# Patient Record
Sex: Female | Born: 1990 | Race: White | Hispanic: No | Marital: Single | Smoking: Current every day smoker
Health system: Southern US, Community
[De-identification: ages and names within clinical notes are randomized; demographics above are authoritative.]

## PROBLEM LIST (undated history)

## (undated) DIAGNOSIS — F410 Panic disorder [episodic paroxysmal anxiety] without agoraphobia: Secondary | ICD-10-CM

## (undated) DIAGNOSIS — I493 Ventricular premature depolarization: Secondary | ICD-10-CM

## (undated) DIAGNOSIS — I10 Essential (primary) hypertension: Secondary | ICD-10-CM

## (undated) DIAGNOSIS — J45909 Unspecified asthma, uncomplicated: Secondary | ICD-10-CM

## (undated) HISTORY — DX: Unspecified asthma, uncomplicated: J45.909

---

## 2017-11-07 ENCOUNTER — Encounter (HOSPITAL_COMMUNITY): Payer: Self-pay

## 2017-11-07 ENCOUNTER — Emergency Department (HOSPITAL_COMMUNITY)
Admission: EM | Admit: 2017-11-07 | Discharge: 2017-11-08 | Disposition: A | Discharge status: Home / Self Care | Payer: Medicaid Other | Attending: Emergency Medicine | Admitting: Emergency Medicine

## 2017-11-07 DIAGNOSIS — R202 Paresthesia of skin: Secondary | ICD-10-CM

## 2017-11-07 DIAGNOSIS — F17200 Nicotine dependence, unspecified, uncomplicated: Secondary | ICD-10-CM | POA: Diagnosis not present

## 2017-11-07 DIAGNOSIS — R200 Anesthesia of skin: Secondary | ICD-10-CM | POA: Diagnosis present

## 2017-11-07 DIAGNOSIS — Z91040 Latex allergy status: Secondary | ICD-10-CM | POA: Diagnosis not present

## 2017-11-07 DIAGNOSIS — I10 Essential (primary) hypertension: Secondary | ICD-10-CM | POA: Insufficient documentation

## 2017-11-07 HISTORY — DX: Essential (primary) hypertension: I10

## 2017-11-07 HISTORY — DX: Panic disorder (episodic paroxysmal anxiety): F41.0

## 2017-11-07 HISTORY — DX: Ventricular premature depolarization: I49.3

## 2017-11-07 LAB — I-STAT CHEM 8, ED
BUN: 9 mg/dL (ref 6–20)
CALCIUM ION: 1.12 mmol/L — AB (ref 1.15–1.40)
CHLORIDE: 104 mmol/L (ref 98–111)
CREATININE: 0.9 mg/dL (ref 0.44–1.00)
Glucose, Bld: 110 mg/dL — ABNORMAL HIGH (ref 70–99)
HEMATOCRIT: 42 % (ref 36.0–46.0)
HEMOGLOBIN: 14.3 g/dL (ref 12.0–15.0)
Potassium: 3.7 mmol/L (ref 3.5–5.1)
Sodium: 140 mmol/L (ref 135–145)
TCO2: 26 mmol/L (ref 22–32)

## 2017-11-07 LAB — DIFFERENTIAL
Abs Immature Granulocytes: 0.1 10*3/uL (ref 0.0–0.1)
Basophils Absolute: 0.1 10*3/uL (ref 0.0–0.1)
Basophils Relative: 1 %
Eosinophils Absolute: 0.9 10*3/uL — ABNORMAL HIGH (ref 0.0–0.7)
Eosinophils Relative: 6 %
Immature Granulocytes: 1 %
Lymphocytes Relative: 32 %
Lymphs Abs: 4.7 10*3/uL — ABNORMAL HIGH (ref 0.7–4.0)
MONO ABS: 1.1 10*3/uL — AB (ref 0.1–1.0)
Monocytes Relative: 8 %
NEUTROS ABS: 7.9 10*3/uL — AB (ref 1.7–7.7)
NEUTROS PCT: 52 %

## 2017-11-07 LAB — CBC
HCT: 45.1 % (ref 36.0–46.0)
HEMOGLOBIN: 15 g/dL (ref 12.0–15.0)
MCH: 30 pg (ref 26.0–34.0)
MCHC: 33.3 g/dL (ref 30.0–36.0)
MCV: 90.2 fL (ref 78.0–100.0)
Platelets: 344 10*3/uL (ref 150–400)
RBC: 5 MIL/uL (ref 3.87–5.11)
RDW: 12.8 % (ref 11.5–15.5)
WBC: 14.8 10*3/uL — ABNORMAL HIGH (ref 4.0–10.5)

## 2017-11-07 LAB — I-STAT TROPONIN, ED: Troponin i, poc: 0 ng/mL (ref 0.00–0.08)

## 2017-11-07 LAB — I-STAT BETA HCG BLOOD, ED (MC, WL, AP ONLY): I-stat hCG, quantitative: <5 m[IU]/mL (ref <5)

## 2017-11-07 LAB — PROTIME-INR
INR: 0.97
Prothrombin Time: 12.8 seconds (ref 11.4–15.2)

## 2017-11-07 LAB — APTT: aPTT: 28 seconds (ref 24–36)

## 2017-11-07 NOTE — ED Triage Notes (Signed)
Pt reports that she has r sided facial numbness that has been going on for years, comes and goes, nothing worse today, no facial droop, neuro intact bilaterally

## 2017-11-08 LAB — COMPREHENSIVE METABOLIC PANEL
ALBUMIN: 4 g/dL (ref 3.5–5.0)
ALT: 40 U/L (ref 0–44)
AST: 27 U/L (ref 15–41)
Alkaline Phosphatase: 90 U/L (ref 38–126)
Anion gap: 9 (ref 5–15)
BILIRUBIN TOTAL: 0.4 mg/dL (ref 0.3–1.2)
BUN: 8 mg/dL (ref 6–20)
CO2: 25 mmol/L (ref 22–32)
CREATININE: 0.91 mg/dL (ref 0.44–1.00)
Calcium: 9.1 mg/dL (ref 8.9–10.3)
Chloride: 106 mmol/L (ref 98–111)
GFR calc Af Amer: >60 mL/min (ref >60)
GLUCOSE: 115 mg/dL — AB (ref 70–99)
Potassium: 3.8 mmol/L (ref 3.5–5.1)
Sodium: 140 mmol/L (ref 135–145)
TOTAL PROTEIN: 6.7 g/dL (ref 6.5–8.1)

## 2017-11-08 NOTE — ED Notes (Signed)
Discharge instructions reviewed with patient. Follow up care reviewed. Signature pad unavailable at time of pt discharge. Pt verbalized understanding of instructions.

## 2017-11-08 NOTE — Discharge Instructions (Signed)
Schedule follow-up with 1 of the listed neurology groups to have outpatient work-up of your numbness and tingling.  If you have any worsening of the symptoms, return to the ER.

## 2017-11-08 NOTE — ED Provider Notes (Signed)
[facility] EMERGENCY DEPARTMENT Provider Note   CSN: [id number] Arrival date & time: 11/07/17  2256     History   Chief Complaint Chief Complaint  Patient presents with  . Numbness    HPI Tracy Harper is a 27 y.o. female.  Patient presents to the ER for evaluation of right facial numbness.  She reports that this has been occurring intermittently for a year or more.  She reports that some days she wakes up and she feels like pins-and-needles on the right side of her face.  This is occurring today.  She has never noticed any facial droop or weakness of the face.  He has never had any speech disturbance or difficulty speaking.  She has had intermittent episodes of similar sensations in both of her arms, but never at the same time.  No history of headaches.     Past Medical History:  Diagnosis Date  . Hypertension   . Panic   . PVC (premature ventricular contraction)     There are no active problems to display for this patient.   History reviewed. No pertinent surgical history.   OB History   None      Home Medications    Prior to Admission medications   Not on File    Family History No family history on file.  Social History Social History   Tobacco Use  . Smoking status: Current Every Day Smoker  . Smokeless tobacco: Never Used  Substance Use Topics  . Alcohol use: Yes  . Drug use: Never     Allergies   Amoxicillin and Latex   Review of Systems Review of Systems  Neurological: Positive for numbness.  All other systems reviewed and are negative.    Physical Exam Updated Vital Signs BP 136/75   Pulse 70   Temp 98 F (36.7 C) (Oral)   Resp 18   LMP 10/24/2017   SpO2 99%   Physical Exam  Constitutional: She is oriented to person, place, and time. She appears well-developed and well-nourished. No distress.  HENT:  Head: Normocephalic and atraumatic.  Right Ear: Hearing normal.  Left Ear: Hearing normal.  Nose:  Nose normal.  Mouth/Throat: Oropharynx is clear and moist and mucous membranes are normal.  Eyes: Pupils are equal, round, and reactive to light. Conjunctivae and EOM are normal.  Neck: Normal range of motion. Neck supple.  Cardiovascular: Regular rhythm, S1 normal and S2 normal. Exam reveals no gallop and no friction rub.  No murmur heard. Pulmonary/Chest: Effort normal and breath sounds normal. No respiratory distress. She exhibits no tenderness.  Abdominal: Soft. Normal appearance and bowel sounds are normal. There is no hepatosplenomegaly. There is no tenderness. There is no rebound, no guarding, no tenderness at McBurney's point and negative Murphy's sign. No hernia.  Musculoskeletal: Normal range of motion.  Neurological: She is alert and oriented to person, place, and time. She has normal strength. No cranial nerve deficit or sensory deficit. Coordination normal. GCS eye subscore is 4. GCS verbal subscore is 5. GCS motor subscore is 6.  Skin: Skin is warm, dry and intact. No rash noted. No cyanosis.  Psychiatric: She has a normal mood and affect. Her speech is normal and behavior is normal. Thought content normal.  Nursing note and vitals reviewed.    ED Treatments / Results  Labs (all labs ordered are listed, but only abnormal results are displayed) Labs Reviewed  CBC - Abnormal; Notable for the following components:  Result Value   WBC 14.8 (*)    All other components within normal limits  DIFFERENTIAL - Abnormal; Notable for the following components:   Neutro Abs 7.9 (*)    Lymphs Abs 4.7 (*)    Monocytes Absolute 1.1 (*)    Eosinophils Absolute 0.9 (*)    All other components within normal limits  COMPREHENSIVE METABOLIC PANEL - Abnormal; Notable for the following components:   Glucose, Bld 115 (*)    All other components within normal limits  I-STAT CHEM 8, ED - Abnormal; Notable for the following components:   Glucose, Bld 110 (*)    Calcium, Ion 1.12 (*)    All  other components within normal limits  PROTIME-INR  APTT  I-STAT TROPONIN, ED  I-STAT BETA HCG BLOOD, ED ([facility], [facility], [facility] ONLY)    EKG None  Radiology No results found.  Procedures Procedures (including critical care time)  Medications Ordered in ED Medications - No data to display   Initial Impression / Assessment and Plan / ED Course  I have reviewed the triage vital signs and the nursing notes.  Pertinent labs & imaging results that were available during my care of the patient were reviewed by me and considered in my medical decision making (see chart for details).     Patient presents with intermittent numbness of the right side of her face.  She reports that this has been ongoing for years.  It will come and go and she has not identified anything that causes it.  It generally goes away after about a day on its own without any intervention.  She is currently feeling the right sided numbness.  Her examination, however, does not reveal any cranial nerve deficit.  She has normal sensation to light touch, no droop.  I did discuss with her the possibility of MS.  I doubt that this intermittent numbness that has been ongoing for years is a stroke.  I did discuss with her the possibility of performing an MRI tonight.  Patient did not want to stay in the ER for the length of time that it would take to perform the MRI.  I do feel that this is reasonable to work-up as an outpatient.  She will therefore be referred to neurology for further evaluation.  Patient was counseled to come to the ER if she has any worsening of her symptoms including facial droop, speech difficulty or worsening of her numbness.  Final Clinical Impressions(s) / ED Diagnoses   Final diagnoses:  Paresthesia    ED Discharge Orders    None       Cecillia Menees, Canary Brimhristopher J, MD 11/08/17 [phone]

## 2017-11-08 NOTE — ED Notes (Signed)
ED Provider at bedside. 

## 2017-11-13 ENCOUNTER — Encounter: Payer: Self-pay | Admitting: Neurology

## 2017-11-13 ENCOUNTER — Ambulatory Visit: Payer: Medicaid Other | Admitting: Neurology

## 2017-11-13 ENCOUNTER — Telehealth: Payer: Self-pay | Admitting: Neurology

## 2017-11-13 VITALS — BP 167/90 | HR 92 | Ht 62.0 in | Wt 297.0 lb

## 2017-11-13 DIAGNOSIS — R200 Anesthesia of skin: Secondary | ICD-10-CM

## 2017-11-13 DIAGNOSIS — R202 Paresthesia of skin: Secondary | ICD-10-CM | POA: Diagnosis not present

## 2017-11-13 NOTE — Progress Notes (Signed)
[facility] [address] [place]. [place] [id number] [phone]       OFFICE CONSULT NOTE  Tracy. Hipolito BayleyKirsten Harper Date of Birth:  08/01/1990 Medical Record Number:  [id number]   Referring MD:  Jaci Carrelhristopher Pollina  Reason for Referral: numbness  HPI: Tracy Harper is a 89 year Caucasian lady who is today accompanied by her friend. She states she's been having intermittent right face and hand paresthesias for the last 3 years which appear to be getting worse particularly in the last 3 weeks when they are now occurring on a daily basis. He describes these as a feeling of tingling which involves her right side of her forehead as well as the upper cheeks. It at times also involves her fingertips in the right hand. There are previously occurring quieting frequently but for the last 23 weeks they have been now occurring daily. They may last for several minutes to an hour or occasionally up to a whole day. She is also concerned as sometimes this tingling is now spreading to the left side of the face as well as the left hand as well but never at the same time. She denies any weakness, headache, blurred vision, loss of vision, diplopia, vertigo, gait or balance difficulties. She denies any excessive fatigue or tiredness or bladder urgency or incontinence. 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 She does admit to mild anxiety but denies significant stressors her depression. She was seen in the ER on 11/08/17 and basic lab work including CBC and metabolic panel labs were negative. No brain imaging was done. She does have history of tick bites about 4 years ago but states Lyme titer at that time was negative. 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 She does have a 29-year-old baby girl. She denies any family history of multiple sclerosis, seizures, migraines, strokes and INH. He denies any skin rash, history of DVT or  pulmonary embolism. She does not participate in any activities for stress relaxation exercises.  ROS:   14 system review of systems is positive for  numbness, anxiety and all other systems negative  PMH:  Past Medical History:  Diagnosis Date  . Asthma   . Hypertension   . Panic   . PVC (premature ventricular contraction)     Social History:  Social History   Socioeconomic History  . Marital status: Single    Spouse name: Not on file  . Number of children: Not on file  . Years of education: Not on file  . Highest education level: Not on file  Occupational History  . Not on file  Social Needs  . Financial resource strain: Not on file  . Food insecurity:    Worry: Not on file    Inability: Not on file  . Transportation needs:    Medical: Not on file    Non-medical: Not on file  Tobacco Use  . Smoking status: Current Every Day Smoker    Packs/day: 0.50  . Smokeless tobacco: Never Used  Substance and Sexual Activity  . Alcohol use: Yes    Alcohol/week: 1.2 oz    Types: 1 Cans of beer, 1 Shots of liquor per week    Comment: twice weekly  . Drug use: Never  . Sexual activity: Not on file  Lifestyle  . Physical activity:    Days per week: Not on file    Minutes per  session: Not on file  . Stress: Not on file  Relationships  . Social connections:    Talks on phone: Not on file    Gets together: Not on file    Attends religious service: Not on file    Active member of club or organization: Not on file    Attends meetings of clubs or organizations: Not on file    Relationship status: Not on file  . Intimate partner violence:    Fear of current or ex partner: Not on file    Emotionally abused: Not on file    Physically abused: Not on file    Forced sexual activity: Not on file  Other Topics Concern  . Not on file  Social History Narrative  . Not on file    Medications:   Current Outpatient Medications on File Prior to Visit  Medication Sig Dispense Refill    . albuterol (VENTOLIN HFA) 108 (90 Base) MCG/ACT inhaler Ventolin HFA 90 mcg/actuation aerosol inhaler    . ibuprofen (ADVIL,MOTRIN) 800 MG tablet Take by mouth every 6 (six) hours as needed.      No current facility-administered medications on file prior to visit.     Allergies:   Allergies  Allergen Reactions  . Amoxicillin     Childhood   . Bactrim [Sulfamethoxazole-Trimethoprim]     hives  . Latex Rash    Physical Exam General: Obese young Caucasian lady seated, in no evident distress Head: head normocephalic and atraumatic.   Neck: supple with no carotid or supraclavicular bruits Cardiovascular: regular rate and rhythm, no murmurs Musculoskeletal: no deformity Skin:  no rash/petichiae Vascular:  Normal pulses all extremities  Neurologic Exam Mental Status: Awake and fully alert. Oriented to place and time. Recent and remote memory intact. Attention span, concentration and fund of knowledge appropriate. Mood and affect appropriate.  Cranial Nerves: Fundoscopic exam reveals sharp disc margins. Pupils equal, briskly reactive to light. Extraocular movements full without nystagmus. Visual fields full to confrontation. Hearing intact. Facial sensation intact. Face, tongue, palate moves normally and symmetrically.  Motor: Normal bulk and tone. Normal strength in all tested extremity muscles. Sensory.: intact to touch , pinprick , position and vibratory sensation.  Coordination: Rapid alternating movements normal in all extremities. Finger-to-nose and heel-to-shin performed accurately bilaterally. Gait and Station: Arises from chair without difficulty. Stance is normal. Gait demonstrates normal stride length and balance . Able to heel, toe and tandem walk with some difficulty.  Reflexes: 1+ and symmetric. Toes downgoing.       ASSESSMENT: 27 year old Caucasian lady with 3 year history of intermittent right face and arm paresthesias of unclear etiology which appear to be getting  progressive. Differential diagnoses includes demyelinating, inflammatory, autoimmune disease or stress/anxiety    PLAN: I had a long discussion with the patient and her friend regarding her recurrent episodes of right face and extremity paresthesias and discuss differential diagnosis and answered questions. I recommend the check MRI scan of the brain and cervical spine with and without contrast to rule out structural or vascular lesions. Check lab work for ANA, B12, TSH, Lyme titer and antiphospholipid antibodies. Advised the patient to participate in regular activities for stress relaxation. She will return for follow-up in 2 months or call earlier if necessary. Greater than 50% time during this 45 minute consultation visit was spent on counseling and coordination of care about her paresthesias and discussing differential diagnosis and evaluation and treatment plan. Tracy Heady, MD  [facility] [address]  [place], [place] [address]  Phone [phone] Fax [phone] Note: This document was prepared with digital dictation and possible smart phrase technology. Any transcriptional errors that result from this process are unintentional.

## 2017-11-13 NOTE — Patient Instructions (Addendum)
I had a long discussion with the patient and her friend regarding her recurrent episodes of right face and extremity paresthesias and discuss differential diagnosis and answered questions. I recommend the check MRI scan of the brain and cervical spine with and without contrast to rule out structural or vascular lesions. Check lab work for ANA, B12, TSH, Lyme titer and antiphospholipid antibodies. Advised the patient to participate in regular activities for stress relaxation. She will return for follow-up in 2 months or call earlier if necessary.

## 2017-11-13 NOTE — Telephone Encounter (Signed)
Medicaid order sent to GI. They will obtain the auth and will reach out to the pt to schedule.  °

## 2017-11-16 ENCOUNTER — Telehealth: Payer: Self-pay

## 2017-11-16 LAB — TSH: TSH: 4.37 u[IU]/mL (ref 0.450–4.500)

## 2017-11-16 LAB — VITAMIN B12: Vitamin B-12: 332 pg/mL (ref 232–1245)

## 2017-11-16 LAB — ANA: Anti Nuclear Antibody(ANA): NEGATIVE

## 2017-11-16 LAB — B. BURGDORFI ANTIBODIES: Lyme IgG/IgM Ab: <0.91 {ISR} (ref 0.00–0.90)

## 2017-11-16 NOTE — Telephone Encounter (Signed)
Rn call patient that the labs were normal. The labs listed were the only ones he order. Pt verbalized understanding. ------

## 2017-11-16 NOTE — Telephone Encounter (Signed)
Notes recorded by Hildred AlaminMurrell, Katrina Y, RN on 11/16/2017 at 11:13 AM EDT Left vm for patient to call about lab results. ------

## 2017-11-16 NOTE — Telephone Encounter (Signed)
Pt is wanting to know if thyroid, B12 and lyme disease were all the labs that were run. Please call to advise

## 2017-11-16 NOTE — Telephone Encounter (Signed)
-----   Message from Tracy RileyPramod S Sethi, MD sent at 11/16/2017  8:51 AM EDT ----- Joneen RoachKindly inform the patient that vitamin B12, thyroid hormone and test for Lyme disease were all normal

## 2017-11-19 ENCOUNTER — Other Ambulatory Visit: Payer: Self-pay

## 2018-01-22 ENCOUNTER — Telehealth: Payer: Self-pay

## 2018-01-22 ENCOUNTER — Ambulatory Visit: Payer: Medicaid Other | Admitting: Neurology

## 2018-01-22 NOTE — Telephone Encounter (Signed)
Patient no show for appt today. 

## 2018-01-24 ENCOUNTER — Encounter: Payer: Self-pay | Admitting: Neurology

## 2018-10-08 ENCOUNTER — Encounter (HOSPITAL_COMMUNITY): Payer: Self-pay | Admitting: *Deleted

## 2018-10-08 ENCOUNTER — Emergency Department (HOSPITAL_COMMUNITY)
Admission: EM | Admit: 2018-10-08 | Discharge: 2018-10-08 | Disposition: A | Discharge status: Home / Self Care | Payer: Medicaid Other | Attending: Emergency Medicine | Admitting: Emergency Medicine

## 2018-10-08 ENCOUNTER — Other Ambulatory Visit: Payer: Self-pay

## 2018-10-08 DIAGNOSIS — B9689 Other specified bacterial agents as the cause of diseases classified elsewhere: Secondary | ICD-10-CM

## 2018-10-08 DIAGNOSIS — K644 Residual hemorrhoidal skin tags: Secondary | ICD-10-CM | POA: Diagnosis not present

## 2018-10-08 DIAGNOSIS — N760 Acute vaginitis: Secondary | ICD-10-CM | POA: Diagnosis not present

## 2018-10-08 DIAGNOSIS — K625 Hemorrhage of anus and rectum: Secondary | ICD-10-CM | POA: Diagnosis present

## 2018-10-08 DIAGNOSIS — A599 Trichomoniasis, unspecified: Secondary | ICD-10-CM | POA: Diagnosis not present

## 2018-10-08 DIAGNOSIS — I10 Essential (primary) hypertension: Secondary | ICD-10-CM | POA: Diagnosis not present

## 2018-10-08 DIAGNOSIS — F17210 Nicotine dependence, cigarettes, uncomplicated: Secondary | ICD-10-CM | POA: Insufficient documentation

## 2018-10-08 DIAGNOSIS — Z91040 Latex allergy status: Secondary | ICD-10-CM | POA: Insufficient documentation

## 2018-10-08 DIAGNOSIS — K649 Unspecified hemorrhoids: Secondary | ICD-10-CM

## 2018-10-08 LAB — URINALYSIS, ROUTINE W REFLEX MICROSCOPIC
Bilirubin Urine: NEGATIVE
Glucose, UA: NEGATIVE mg/dL
Ketones, ur: NEGATIVE mg/dL
Nitrite: NEGATIVE
Protein, ur: 30 mg/dL — AB
RBC / HPF: >50 RBC/hpf — ABNORMAL HIGH (ref 0–5)
Specific Gravity, Urine: 1.016 (ref 1.005–1.030)
WBC, UA: >50 WBC/hpf — ABNORMAL HIGH (ref 0–5)
pH: 5 (ref 5.0–8.0)

## 2018-10-08 LAB — WET PREP, GENITAL
Sperm: NONE SEEN
Yeast Wet Prep HPF POC: NONE SEEN

## 2018-10-08 LAB — PREGNANCY, URINE: Preg Test, Ur: NEGATIVE

## 2018-10-08 MED ORDER — CEFTRIAXONE SODIUM 250 MG IJ SOLR: 250.0000 mg | Freq: Once | INTRAMUSCULAR | Status: DC

## 2018-10-08 MED ORDER — AZITHROMYCIN 250 MG PO TABS: 1000.0000 mg | ORAL_TABLET | Freq: Once | ORAL | Status: DC

## 2018-10-08 MED ORDER — METRONIDAZOLE 500 MG PO TABS: 500.0000 mg | ORAL_TABLET | Freq: Two times a day (BID) | ORAL | 0 refills | Status: AC

## 2018-10-08 MED ORDER — METRONIDAZOLE 500 MG PO TABS: 500.0000 mg | ORAL_TABLET | Freq: Two times a day (BID) | ORAL | 0 refills | Status: DC

## 2018-10-08 MED ORDER — LISINOPRIL 10 MG PO TABS: 10.0000 mg | ORAL_TABLET | Freq: Every day | ORAL | 11 refills | Status: DC

## 2018-10-08 MED ORDER — LISINOPRIL 10 MG PO TABS: 10.0000 mg | ORAL_TABLET | Freq: Every day | ORAL | 11 refills | Status: AC

## 2018-10-08 NOTE — Discharge Instructions (Signed)
Schedule to see your primary care Physician for recheck in 1 week.

## 2018-10-08 NOTE — ED Triage Notes (Signed)
Pt in c/o noting bright red blood when she wipes after a bowel movement for the last two days, noticed some pressure in the area but denies pain, no distress noted

## 2018-10-08 NOTE — ED Notes (Signed)
Patient verbalizes understanding of discharge instructions. Opportunity for questioning and answers were provided. Armband removed by staff, pt discharged from ED.  

## 2018-10-08 NOTE — ED Provider Notes (Addendum)
[facility] EMERGENCY DEPARTMENT Provider Note   CSN: [id number] Arrival date & time: 10/08/18  1636    History   Chief Complaint Chief Complaint  Patient presents with  . Hemorrhoids    HPI Tracy Harper is a 28 y.o. female.     The history is provided by the patient. No language interpreter was used.  Vaginal Discharge  Quality:  Watery Severity:  Moderate Onset quality:  Gradual Timing:  Constant Progression:  Worsening Chronicity:  New Relieved by:  Nothing Worsened by:  Nothing Ineffective treatments:  None tried Associated symptoms: no abdominal pain    Pt also request evaluation of hemmorhoids.  Past Medical History:  Diagnosis Date  . Asthma   . Hypertension   . Panic   . PVC (premature ventricular contraction)     There are no active problems to display for this patient.   History reviewed. No pertinent surgical history.   OB History   No obstetric history on file.      Home Medications    Prior to Admission medications   Medication Sig Start Date End Date Taking? Authorizing Provider  albuterol (VENTOLIN HFA) 108 (90 Base) MCG/ACT inhaler Inhale 2 puffs into the lungs every 6 (six) hours as needed for wheezing or shortness of breath.    Yes [provider]  ketorolac (TORADOL) 10 MG tablet Take 10 mg by mouth every 6 (six) hours as needed for moderate pain (kidney stones).   Yes [provider]  lisinopril (ZESTRIL) 10 MG tablet Take 1 tablet (10 mg total) by mouth daily. 10/08/18 10/08/19  Fransico Meadow, PA-C  metroNIDAZOLE (FLAGYL) 500 MG tablet Take 1 tablet (500 mg total) by mouth 2 (two) times daily. 10/08/18   Fransico Meadow, PA-C    Family History History reviewed. No pertinent family history.  Social History Social History   Tobacco Use  . Smoking status: Current Every Day Smoker    Packs/day: 0.50  . Smokeless tobacco: Never Used  Substance Use Topics  . Alcohol use: Yes    Alcohol/week:  2.0 standard drinks    Types: 1 Cans of beer, 1 Shots of liquor per week    Comment: twice weekly  . Drug use: Never     Allergies   Amoxicillin; Bactrim [sulfamethoxazole-trimethoprim]; Shellfish allergy; and Latex   Review of Systems Review of Systems  Gastrointestinal: Positive for rectal pain. Negative for abdominal pain.  Genitourinary: Positive for vaginal discharge.  All other systems reviewed and are negative.    Physical Exam Updated Vital Signs BP (!) 170/107   Pulse 93   Temp 98 F (36.7 C) (Oral)   Resp 18   SpO2 98%   Physical Exam Vitals signs and nursing note reviewed.  Constitutional:      Appearance: She is well-developed.  HENT:     Head: Normocephalic.  Eyes:     Pupils: Pupils are equal, round, and reactive to light.  Neck:     Musculoskeletal: Normal range of motion.  Cardiovascular:     Rate and Rhythm: Normal rate.  Pulmonary:     Effort: Pulmonary effort is normal.  Abdominal:     General: Abdomen is flat. There is no distension.  Genitourinary:    Vagina: Vaginal discharge present.     Comments: Small bleeding hemorrhoid 1cm Thin white vaginal discharge.  Musculoskeletal: Normal range of motion.  Skin:    General: Skin is warm.  Neurological:     Mental Status: She  is alert and oriented to person, place, and time.  Psychiatric:        Mood and Affect: Mood normal.      ED Treatments / Results  Labs (all labs ordered are listed, but only abnormal results are displayed) Labs Reviewed  WET PREP, GENITAL - Abnormal; Notable for the following components:      Result Value   Trich, Wet Prep PRESENT (*)    Clue Cells Wet Prep HPF POC PRESENT (*)    WBC, Wet Prep HPF POC MANY (*)    All other components within normal limits  URINALYSIS, ROUTINE W REFLEX MICROSCOPIC - Abnormal; Notable for the following components:   APPearance CLOUDY (*)    Hgb urine dipstick MODERATE (*)    Protein, ur 30 (*)    Leukocytes,Ua LARGE (*)    RBC  / HPF >50 (*)    WBC, UA >50 (*)    Bacteria, UA MANY (*)    Trichomonas, UA PRESENT (*)    All other components within normal limits  PREGNANCY, URINE  GC/CHLAMYDIA PROBE AMP ([place]) NOT AT [facility]    EKG None  Radiology No results found.  Procedures Procedures (including critical care time)  Medications Ordered in ED Medications - No data to display   Initial Impression / Assessment and Plan / ED Course  I have reviewed the triage vital signs and the nursing notes.  Pertinent labs & imaging results that were available during my care of the patient were reviewed by me and considered in my medical decision making (see chart for details).        MDM  Pt counseled on trichomonas and hemorrhoids   Final Clinical Impressions(s) / ED Diagnoses   Final diagnoses:  Hemorrhoids, unspecified hemorrhoid type  Trichomonas infection  BV (bacterial vaginosis)  Hypertension, unspecified type    ED Discharge Orders         Ordered    metroNIDAZOLE (FLAGYL) 500 MG tablet  2 times daily,   Status:  Discontinued     10/08/18 1933    lisinopril (ZESTRIL) 10 MG tablet  Daily,   Status:  Discontinued     10/08/18 1941    lisinopril (ZESTRIL) 10 MG tablet  Daily     10/08/18 1943    metroNIDAZOLE (FLAGYL) 500 MG tablet  2 times daily     10/08/18 1950           Osie CheeksSofia, Leslie K, PA-C 10/08/18 2137    Osie CheeksSofia, Leslie K, PA-C 10/08/18 2137    Wynetta FinesMessick, Peter C, MD 10/09/18 1529

## 2018-10-09 ENCOUNTER — Encounter (HOSPITAL_COMMUNITY): Payer: Self-pay | Admitting: Emergency Medicine

## 2018-10-09 ENCOUNTER — Emergency Department (HOSPITAL_COMMUNITY): Payer: Medicaid Other

## 2018-10-09 ENCOUNTER — Emergency Department (HOSPITAL_COMMUNITY)
Admission: EM | Admit: 2018-10-09 | Discharge: 2018-10-09 | Discharge status: Against Medical Advice | Payer: Medicaid Other | Attending: Emergency Medicine | Admitting: Emergency Medicine

## 2018-10-09 ENCOUNTER — Other Ambulatory Visit: Payer: Self-pay

## 2018-10-09 DIAGNOSIS — N200 Calculus of kidney: Secondary | ICD-10-CM | POA: Diagnosis not present

## 2018-10-09 DIAGNOSIS — F17210 Nicotine dependence, cigarettes, uncomplicated: Secondary | ICD-10-CM | POA: Diagnosis not present

## 2018-10-09 DIAGNOSIS — R109 Unspecified abdominal pain: Secondary | ICD-10-CM | POA: Diagnosis present

## 2018-10-09 DIAGNOSIS — N12 Tubulo-interstitial nephritis, not specified as acute or chronic: Secondary | ICD-10-CM

## 2018-10-09 DIAGNOSIS — I10 Essential (primary) hypertension: Secondary | ICD-10-CM | POA: Insufficient documentation

## 2018-10-09 DIAGNOSIS — Z79899 Other long term (current) drug therapy: Secondary | ICD-10-CM | POA: Diagnosis not present

## 2018-10-09 DIAGNOSIS — Z91040 Latex allergy status: Secondary | ICD-10-CM | POA: Diagnosis not present

## 2018-10-09 LAB — COMPREHENSIVE METABOLIC PANEL
ALT: 52 U/L — ABNORMAL HIGH (ref 0–44)
AST: 37 U/L (ref 15–41)
Albumin: 4.1 g/dL (ref 3.5–5.0)
Alkaline Phosphatase: 87 U/L (ref 38–126)
Anion gap: 12 (ref 5–15)
BUN: 10 mg/dL (ref 6–20)
CO2: 22 mmol/L (ref 22–32)
Calcium: 9.3 mg/dL (ref 8.9–10.3)
Chloride: 105 mmol/L (ref 98–111)
Creatinine, Ser: 0.91 mg/dL (ref 0.44–1.00)
GFR calc Af Amer: >60 mL/min (ref >60)
GFR calc non Af Amer: >60 mL/min (ref >60)
Glucose, Bld: 127 mg/dL — ABNORMAL HIGH (ref 70–99)
Potassium: 4.1 mmol/L (ref 3.5–5.1)
Sodium: 139 mmol/L (ref 135–145)
Total Bilirubin: 0.7 mg/dL (ref 0.3–1.2)
Total Protein: 7.2 g/dL (ref 6.5–8.1)

## 2018-10-09 LAB — URINALYSIS, ROUTINE W REFLEX MICROSCOPIC
Bilirubin Urine: NEGATIVE
Glucose, UA: NEGATIVE mg/dL
Ketones, ur: NEGATIVE mg/dL
Nitrite: NEGATIVE
Protein, ur: 100 mg/dL — AB
Specific Gravity, Urine: 1.013 (ref 1.005–1.030)
WBC, UA: >50 WBC/hpf — ABNORMAL HIGH (ref 0–5)
pH: 6 (ref 5.0–8.0)

## 2018-10-09 LAB — CBC WITH DIFFERENTIAL/PLATELET
Abs Immature Granulocytes: 0.17 10*3/uL — ABNORMAL HIGH (ref 0.00–0.07)
Basophils Absolute: 0.1 10*3/uL (ref 0.0–0.1)
Basophils Relative: 0 %
Eosinophils Absolute: 0.2 10*3/uL (ref 0.0–0.5)
Eosinophils Relative: 1 %
HCT: 46.7 % — ABNORMAL HIGH (ref 36.0–46.0)
Hemoglobin: 15.8 g/dL — ABNORMAL HIGH (ref 12.0–15.0)
Immature Granulocytes: 1 %
Lymphocytes Relative: 6 %
Lymphs Abs: 1.4 10*3/uL (ref 0.7–4.0)
MCH: 30.3 pg (ref 26.0–34.0)
MCHC: 33.8 g/dL (ref 30.0–36.0)
MCV: 89.5 fL (ref 80.0–100.0)
Monocytes Absolute: 1.7 10*3/uL — ABNORMAL HIGH (ref 0.1–1.0)
Monocytes Relative: 7 %
Neutro Abs: 20.9 10*3/uL — ABNORMAL HIGH (ref 1.7–7.7)
Neutrophils Relative %: 85 %
Platelets: 301 10*3/uL (ref 150–400)
RBC: 5.22 MIL/uL — ABNORMAL HIGH (ref 3.87–5.11)
RDW: 12.9 % (ref 11.5–15.5)
WBC: 24.4 10*3/uL — ABNORMAL HIGH (ref 4.0–10.5)
nRBC: 0 % (ref 0.0–0.2)

## 2018-10-09 LAB — GC/CHLAMYDIA PROBE AMP (~~LOC~~) NOT AT ARMC
Chlamydia: NEGATIVE
Neisseria Gonorrhea: POSITIVE — AB

## 2018-10-09 LAB — PREGNANCY, URINE: Preg Test, Ur: NEGATIVE

## 2018-10-09 LAB — LIPASE, BLOOD: Lipase: 21 U/L (ref 11–51)

## 2018-10-09 MED ORDER — SODIUM CHLORIDE 0.9 % IV BOLUS (SEPSIS)
1000.0000 mL | Freq: Once | INTRAVENOUS | Status: AC
  Administered 2018-10-09: 1000 mL via INTRAVENOUS

## 2018-10-09 MED ORDER — ONDANSETRON HCL 4 MG/2ML IJ SOLN
4.0000 mg | Freq: Once | INTRAMUSCULAR | Status: AC
  Administered 2018-10-09: 4 mg via INTRAVENOUS
  Filled 2018-10-09: qty 2

## 2018-10-09 MED ORDER — ACETAMINOPHEN 500 MG PO TABS
1000.0000 mg | ORAL_TABLET | Freq: Once | ORAL | Status: AC
  Administered 2018-10-09: 1000 mg via ORAL
  Filled 2018-10-09: qty 2

## 2018-10-09 MED ORDER — CEPHALEXIN 500 MG PO CAPS: 500.0000 mg | ORAL_CAPSULE | Freq: Four times a day (QID) | ORAL | 0 refills | Status: AC

## 2018-10-09 MED ORDER — SODIUM CHLORIDE 0.9 % IV SOLN
1.0000 g | Freq: Once | INTRAVENOUS | Status: AC
  Administered 2018-10-09: 15:00:00 1 g via INTRAVENOUS
  Filled 2018-10-09: qty 10

## 2018-10-09 MED ORDER — KETOROLAC TROMETHAMINE 15 MG/ML IJ SOLN
15.0000 mg | Freq: Once | INTRAMUSCULAR | Status: AC
  Administered 2018-10-09: 15 mg via INTRAVENOUS
  Filled 2018-10-09: qty 1

## 2018-10-09 MED ORDER — METRONIDAZOLE IVPB CUSTOM
2000.0000 mg | Freq: Once | INTRAVENOUS | Status: AC
  Administered 2018-10-09: 2000 mg via INTRAVENOUS
  Filled 2018-10-09 (×2): qty 400

## 2018-10-09 NOTE — ED Provider Notes (Signed)
[facility] EMERGENCY DEPARTMENT Provider Note   CSN: [id number] Arrival date & time: 10/09/18  1124    History   Chief Complaint Chief Complaint  Patient presents with  . Flank Pain    HPI Tracy Harper is a 28 y.o. female.     Patient is a 28 year old female with past medical history of asthma, hypertension who presents emergency department for left-sided flank pain.  Patient also has a history of recurrent kidney stones on the left side.  Patient reports that last night the pain started all of a sudden.  It is consistent with pain she has had in the past with her kidney stones.  Denies any fever, chills, hematuria, dysuria.  She does have nausea.  She was seen in the emergency department yesterday for vaginal discharge and was found to be positive for trichomonas.  GC and chlamydia is still pending.  Patient reports that she has not taken her medication yet for trichomonas.  She denies any pelvic pain.     Past Medical History:  Diagnosis Date  . Asthma   . Hypertension   . Panic   . PVC (premature ventricular contraction)     There are no active problems to display for this patient.   History reviewed. No pertinent surgical history.   OB History   No obstetric history on file.      Home Medications    Prior to Admission medications   Medication Sig Start Date End Date Taking? Authorizing Provider  albuterol (VENTOLIN HFA) 108 (90 Base) MCG/ACT inhaler Inhale 2 puffs into the lungs every 6 (six) hours as needed for wheezing or shortness of breath.     [provider]  ketorolac (TORADOL) 10 MG tablet Take 10 mg by mouth every 6 (six) hours as needed for moderate pain (kidney stones).    [provider]  lisinopril (ZESTRIL) 10 MG tablet Take 1 tablet (10 mg total) by mouth daily. 10/08/18 10/08/19  Fransico Meadow, PA-C  metroNIDAZOLE (FLAGYL) 500 MG tablet Take 1 tablet (500 mg total) by mouth 2 (two) times daily. 10/08/18    Fransico Meadow, PA-C    Family History No family history on file.  Social History Social History   Tobacco Use  . Smoking status: Current Every Day Smoker    Packs/day: 0.50  . Smokeless tobacco: Never Used  Substance Use Topics  . Alcohol use: Yes    Alcohol/week: 2.0 standard drinks    Types: 1 Cans of beer, 1 Shots of liquor per week    Comment: twice weekly  . Drug use: Never     Allergies   Amoxicillin; Bactrim [sulfamethoxazole-trimethoprim]; Shellfish allergy; and Latex   Review of Systems Review of Systems  Constitutional: Positive for appetite change. Negative for chills, fatigue and fever.  Respiratory: Negative for cough and shortness of breath.   Cardiovascular: Negative for chest pain.  Gastrointestinal: Positive for nausea. Negative for abdominal pain, diarrhea and vomiting.  Endocrine: Negative for polyuria.  Genitourinary: Positive for flank pain. Negative for difficulty urinating, dysuria, hematuria, menstrual problem and pelvic pain.  Musculoskeletal: Positive for back pain.  Skin: Negative for rash.  Neurological: Negative for dizziness, light-headedness and headaches.  Hematological: Does not bruise/bleed easily.     Physical Exam Updated Vital Signs BP (!) 144/90   Pulse 98   Temp (!) 100.7 F (38.2 C) (Oral)   SpO2 97%   Physical Exam Vitals signs and nursing note reviewed.  Constitutional:  General: She is not in acute distress.    Appearance: She is obese. She is not ill-appearing, toxic-appearing or diaphoretic.  HENT:     Head: Normocephalic.     Mouth/Throat:     Pharynx: Oropharynx is clear.  Eyes:     Conjunctiva/sclera: Conjunctivae normal.  Cardiovascular:     Rate and Rhythm: Normal rate.  Pulmonary:     Effort: Pulmonary effort is normal.     Breath sounds: Normal breath sounds.  Abdominal:     General: Abdomen is flat. There is no distension.     Tenderness: There is no abdominal tenderness. There is left CVA  tenderness. There is no right CVA tenderness or guarding.  Skin:    General: Skin is warm.  Neurological:     Mental Status: She is alert.  Psychiatric:        Mood and Affect: Mood normal.      ED Treatments / Results  Labs (all labs ordered are listed, but only abnormal results are displayed) Labs Reviewed  CBC WITH DIFFERENTIAL/PLATELET - Abnormal; Notable for the following components:      Result Value   WBC 24.4 (*)    RBC 5.22 (*)    Hemoglobin 15.8 (*)    HCT 46.7 (*)    Neutro Abs 20.9 (*)    Monocytes Absolute 1.7 (*)    Abs Immature Granulocytes 0.17 (*)    All other components within normal limits  URINALYSIS, ROUTINE W REFLEX MICROSCOPIC  PREGNANCY, URINE  COMPREHENSIVE METABOLIC PANEL  LIPASE, BLOOD  URINALYSIS, ROUTINE W REFLEX MICROSCOPIC    EKG None  Radiology No results found.  Procedures Procedures (including critical care time)  Medications Ordered in ED Medications  sodium chloride 0.9 % bolus 1,000 mL (1,000 mLs Intravenous New Bag/Given 10/09/18 1211)  metroNIDAZOLE (FLAGYL) IVPB 2,000 mg 400 mL (has no administration in time range)  ketorolac (TORADOL) 15 MG/ML injection 15 mg (15 mg Intravenous Given 10/09/18 1214)  ondansetron (ZOFRAN) injection 4 mg (4 mg Intravenous Given 10/09/18 1212)     Initial Impression / Assessment and Plan / ED Course  I have reviewed the triage vital signs and the nursing notes.  Pertinent labs & imaging results that were available during my care of the patient were reviewed by me and considered in my medical decision making (see chart for details).  Clinical Course as of Oct 09 1509  Tue Oct 09, 2018  1155 Patient with hx kidney stone presents with acute left flank pain. Also + for vaginal trich yesterday but did not start flagyl. Will work up for kidney stone vs pyelonephritis. Will start with toradol, fluids, zofran and IV flagyl. No pelvic or abdominal complaints to suggest PID. Will obtain US renal and US  pelvic   [KM]  1427 Patient's lab work revealed a metabolic panel with normal BUN and creatinine.  ALT very mildly elevated at 52.  She does have a white count of 24.4.  Urinalysis significant for many bacteria, white blood cells, leukocytes but no nitrites.  Also has hemoglobin and red blood cells consistent with kidney stone.  Her pelvic ultrasound is normal.  Her renal ultrasound shows a 9 mm partially obstructing stone.  Patient notes that this has been here for a long time and she was supposed to follow-up outpatient with urology.  Patient is improved with Toradol and not having any urinary symptoms at this time.  Her vitals are stable and she is resting comfortably on the stretcher.  I am going to consult with urology for next steps at this time.   [KM]  1439 I spoke with Dr. Modena SlaterEugene Bell, urologist who recommended patient be admitted and stent be placed.  I explained this to the patient and she refused.  Explained to the patient the importance of her findings can indicate that she has pyelonephritis related to the obstructing stone and this has a very high likelihood of her worsening which could lead to sepsis and lead to death.  Patient understands.  Patient reports that she has to go home and see her family first prior to having any surgery.  Patient understands it is likely that she will get worse and have to return.  Patient again notes that she understands her risk of death but would like to go home.  She agrees to having a dose of IV Rocephin and taking oral antibiotics at home and calling her urologist tomorrow.  I again explained to the patient that if she were my family member or friend I would like her to stay and get treatment.  She again refused.   [KM]    Clinical Course User Index [KM] Arlyn DunningMcLean, Demarcus Thielke A, PA-C         Final Clinical Impressions(s) / ED Diagnoses   Final diagnoses:  None    ED Discharge Orders    None       Jeral PinchMcLean, Aaron Boeh A, PA-C 10/10/18 1511     Derwood KaplanNanavati, Ankit, MD 10/10/18 [phone]

## 2018-10-09 NOTE — ED Notes (Signed)
ED Provider at bedside. 

## 2018-10-09 NOTE — ED Notes (Signed)
Patient transported to Ultrasound 

## 2018-10-09 NOTE — ED Triage Notes (Signed)
Pt states she has pain on lower left side of her back from kidney stone. Pain usually relieved by tylenol and a warm bath but the interventions did not help this time. Pt states shes had kidney stones for 12 years

## 2018-10-09 NOTE — Discharge Instructions (Signed)
Thank you for allowing me to care for you today. Please return to the emergency department if you have new or worsening symptoms. Take your medications as instructed.  ° °

## 2018-10-11 LAB — URINE CULTURE: Culture: >=100000 — AB

## 2018-10-12 ENCOUNTER — Telehealth: Payer: Self-pay | Admitting: *Deleted

## 2018-10-12 NOTE — Telephone Encounter (Signed)
Post ED Visit - Positive Culture Follow-up  Culture report reviewed by antimicrobial stewardship pharmacist: [facility] Team []  Elenor Quinones, Pharm.D. []  Heide Guile, Pharm.D., BCPS AQ-ID []  Parks Neptune, Pharm.D., BCPS []  Alycia Rossetti, Pharm.D., BCPS []  [place], [place].D., BCPS, AAHIVP []  Legrand Como, Pharm.D., BCPS, AAHIVP []  Salome Arnt, PharmD, BCPS []  Johnnette Gourd, PharmD, BCPS []  Hughes Better, PharmD, BCPS []  Leeroy Cha, PharmD []  Laqueta Linden, PharmD, BCPS []  Albertina Parr, PharmD  [facility] Team []  Leodis Sias, PharmD []  Lindell Spar, PharmD []  Royetta Asal, PharmD []  Graylin Shiver, Rph []  Rema Fendt) Glennon Mac, PharmD []  Arlyn Dunning, PharmD []  Netta Cedars, PharmD []  Dia Sitter, PharmD []  Leone Haven, PharmD []  Gretta Arab, PharmD []  Theodis Shove, PharmD []  Peggyann Juba, PharmD []  Reuel Boom, PharmD Isaias Sakai, PharmD  Positive urine culture Treated with Cephalexin, organism sensitive to the same and no further patient follow-up is required at this time.  Harlon Flor [facility] 10/12/2018, 9:02 AM

## 2018-12-30 ENCOUNTER — Emergency Department (HOSPITAL_COMMUNITY)
Admission: EM | Admit: 2018-12-30 | Discharge: 2018-12-30 | Disposition: A | Discharge status: Home / Self Care | Payer: Medicaid Other | Attending: Emergency Medicine | Admitting: Emergency Medicine

## 2018-12-30 ENCOUNTER — Other Ambulatory Visit: Payer: Self-pay

## 2018-12-30 DIAGNOSIS — F17210 Nicotine dependence, cigarettes, uncomplicated: Secondary | ICD-10-CM | POA: Insufficient documentation

## 2018-12-30 DIAGNOSIS — Z79899 Other long term (current) drug therapy: Secondary | ICD-10-CM | POA: Insufficient documentation

## 2018-12-30 DIAGNOSIS — R102 Pelvic and perineal pain: Secondary | ICD-10-CM | POA: Diagnosis not present

## 2018-12-30 DIAGNOSIS — N938 Other specified abnormal uterine and vaginal bleeding: Secondary | ICD-10-CM | POA: Diagnosis not present

## 2018-12-30 DIAGNOSIS — J45909 Unspecified asthma, uncomplicated: Secondary | ICD-10-CM | POA: Insufficient documentation

## 2018-12-30 DIAGNOSIS — I493 Ventricular premature depolarization: Secondary | ICD-10-CM | POA: Diagnosis not present

## 2018-12-30 DIAGNOSIS — N939 Abnormal uterine and vaginal bleeding, unspecified: Secondary | ICD-10-CM | POA: Diagnosis present

## 2018-12-30 DIAGNOSIS — Z91040 Latex allergy status: Secondary | ICD-10-CM | POA: Diagnosis not present

## 2018-12-30 LAB — URINALYSIS, ROUTINE W REFLEX MICROSCOPIC
Bilirubin Urine: NEGATIVE
Glucose, UA: NEGATIVE mg/dL
Ketones, ur: NEGATIVE mg/dL
Leukocytes,Ua: NEGATIVE
Nitrite: NEGATIVE
Protein, ur: NEGATIVE mg/dL
Specific Gravity, Urine: 1.004 — ABNORMAL LOW (ref 1.005–1.030)
pH: 6 (ref 5.0–8.0)

## 2018-12-30 LAB — POC URINE PREG, ED: Preg Test, Ur: NEGATIVE

## 2018-12-30 LAB — I-STAT BETA HCG BLOOD, ED (MC, WL, AP ONLY): I-stat hCG, quantitative: <5 m[IU]/mL (ref <5)

## 2018-12-30 NOTE — ED Triage Notes (Signed)
Patient states that she has had regular periods all her life.  She states that she is 6 days late, started spotting yesterday.  She states that she had an urge to push, she pushed, a large clot came out and she states she has been bleeding since and the flow has slowed.  Patient wondered if she was pregnant.  She states that she has been having premenopausal symptoms.

## 2018-12-30 NOTE — Discharge Instructions (Addendum)
You were seen today for abnormal uterine bleeding.  You are not pregnant.  Follow-up with your OB/GYN for any ongoing concerns.

## 2018-12-30 NOTE — ED Notes (Addendum)
Pt reports being 30 days late on her menstrual cycle and suddenly having abd cramps with subsequent heavy vaginal bleeding with a large clot. Pt reports pain is gone but still bleeding.

## 2018-12-30 NOTE — ED Notes (Signed)
Patient verbalizes understanding of discharge instructions. Opportunity for questioning and answers were provided. Armband removed by staff, pt discharged from ED home via POV.  

## 2018-12-30 NOTE — ED Provider Notes (Signed)
[facility] Provider [facility]   CSN: [id number] Arrival date & time: 12/30/18  [id number]     History   Chief Complaint Chief Complaint  Patient presents with  . Vaginal Bleeding    HPI Hipolito BayleyKirsten Harper is a 28 y.o. female.     HPI  This is a 28 year old female with a history of asthma and hypertension who presents with vaginal bleeding.  Initially patient reports that she thought her period was 6 to 7 days late and that she might be pregnant.  However, after further evaluation, she feels that she is likely 30+ days late on her period.  She started bleeding vaginally on Friday with some lower abdominal cramping.  The cramping did not lateralize or radiate.  She reports that she increased flow and had to leave work early because she soaked through her clothes.  She reports passing a large clot.  She did not take a pregnancy test at home.  She denies any dizziness.  No known history of dysfunctional uterine bleeding or menorrhagia.  She denies fever, cough, shortness of breath.  Past Medical History:  Diagnosis Date  . Asthma   . Hypertension   . Panic   . PVC (premature ventricular contraction)     There are no active problems to display for this patient.   No past surgical history on file.   OB History   No obstetric history on file.      Home Medications    Prior to Admission medications   Medication Sig Start Date End Date Taking? Authorizing Provider  albuterol (VENTOLIN HFA) 108 (90 Base) MCG/ACT inhaler Inhale 2 puffs into the lungs every 6 (six) hours as needed for wheezing or shortness of breath.     [provider]  cephALEXin (KEFLEX) 500 MG capsule Take 1 capsule (500 mg total) by mouth 4 (four) times daily. 10/09/18   Arlyn DunningMcLean, Kelly A, PA-C  ketorolac (TORADOL) 10 MG tablet Take 10 mg by mouth every 6 (six) hours as needed for moderate pain (kidney stones).    [provider]  lisinopril (ZESTRIL) 10 MG tablet  Take 1 tablet (10 mg total) by mouth daily. 10/08/18 10/08/19  Elson AreasSofia, Leslie K, PA-C  metroNIDAZOLE (FLAGYL) 500 MG tablet Take 1 tablet (500 mg total) by mouth 2 (two) times daily. 10/08/18   Elson AreasSofia, Leslie K, PA-C    Family History No family history on file.  Social History Social History   Tobacco Use  . Smoking status: Current Every Day Smoker    Packs/day: 0.50  . Smokeless tobacco: Never Used  Substance Use Topics  . Alcohol use: Yes    Alcohol/week: 2.0 standard drinks    Types: 1 Cans of beer, 1 Shots of liquor per week    Comment: twice weekly  . Drug use: Never     Allergies   Amoxicillin, Bactrim [sulfamethoxazole-trimethoprim], Shellfish allergy, and Latex   Review of Systems Review of Systems  Constitutional: Negative for fever.  Respiratory: Negative for shortness of breath.   Cardiovascular: Negative for chest pain.  Gastrointestinal: Negative for abdominal pain, nausea and vomiting.  Genitourinary: Positive for pelvic pain and vaginal bleeding. Negative for dysuria.  Musculoskeletal: Negative for back pain.  All other systems reviewed and are negative.    Physical Exam Updated Vital Signs LMP 11/05/2018 (Exact Date)   Physical Exam Vitals signs and nursing note reviewed.  Constitutional:      Appearance: She is well-developed.     Comments:  Morbidly obese  HENT:     Head: Normocephalic and atraumatic.     Mouth/Throat:     Mouth: Mucous membranes are moist.  Eyes:     Pupils: Pupils are equal, round, and reactive to light.  Cardiovascular:     Rate and Rhythm: Normal rate and regular rhythm.  Pulmonary:     Effort: Pulmonary effort is normal. No respiratory distress.  Abdominal:     Palpations: Abdomen is soft.     Tenderness: There is no abdominal tenderness.  Genitourinary:    Comments: External vaginal exam normal, moderate vaginal bleeding noted, no cervical lesions noted, no clots, no cervical motion or adnexal tenderness Musculoskeletal:      Right lower leg: No edema.     Left lower leg: No edema.  Skin:    General: Skin is warm and dry.  Neurological:     Mental Status: She is alert and oriented to person, place, and time.  Psychiatric:        Mood and Affect: Mood normal.      ED Treatments / Results  Labs (all labs ordered are listed, but only abnormal results are displayed) Labs Reviewed  URINALYSIS, ROUTINE W REFLEX MICROSCOPIC - Abnormal; Notable for the following components:      Result Value   Specific Gravity, Urine 1.004 (*)    Hgb urine dipstick LARGE (*)    Bacteria, UA FEW (*)    All other components within normal limits  POC URINE PREG, ED  I-STAT BETA HCG BLOOD, ED ([facility], [facility], [facility] ONLY)    EKG None  Radiology No results found.  Procedures Procedures (including critical care time)  Medications Ordered in ED Medications - No data to display   Initial Impression / Assessment and Plan / ED Course  I have reviewed the triage vital signs and the nursing notes.  Pertinent labs & imaging results that were available during my care of the patient were reviewed by me and considered in my medical decision making (see chart for details).        Patient presents with vaginal bleeding.  She is unsure if she is pregnant.  She is overall nontoxic-appearing and vital signs are reassuring.  She has some mild vaginal bleeding but no passage of clots.  Pregnancy test including blood pregnancy tests are negative.  Nothing to suggest that she was recently pregnant.  I would assume her beta-hCG would still be positive if this represented a miscarriage.  She may just have some dysfunctional bleeding.  Given that she has a benign exam and no significant bleeding, recommend she follow-up closely with OB/GYN.  After history, exam, and medical workup I feel the patient has been appropriately medically screened and is safe for discharge home. Pertinent diagnoses were discussed with the patient. Patient was given return  precautions.   Final Clinical Impressions(s) / ED Diagnoses   Final diagnoses:  Dysfunctional uterine bleeding    ED Discharge Orders    None       Orean Giarratano, Barbette Hair, MD 12/30/18 [phone]

## 2019-12-27 IMAGING — US US PELVIS COMPLETE
1 series · 14 of 25 positions shown · non-contrast
Comparison: None.

CLINICAL DATA: Fever and leukocytosis. Evaluate for ovarian
abscess.

EXAM:
TRANSABDOMINAL AND TRANSVAGINAL ULTRASOUND OF PELVIS
TECHNIQUE: Both transabdominal and transvaginal ultrasound examinations of the
pelvis were performed. Transabdominal technique was performed for
global imaging of the pelvis including uterus, ovaries, adnexal
regions, and pelvic cul-de-sac. Suboptimal transvaginal examination
due to bowel gas.

[Series 1: us pelvis complete · 14 of 59 slices shown]
[im 1/59]
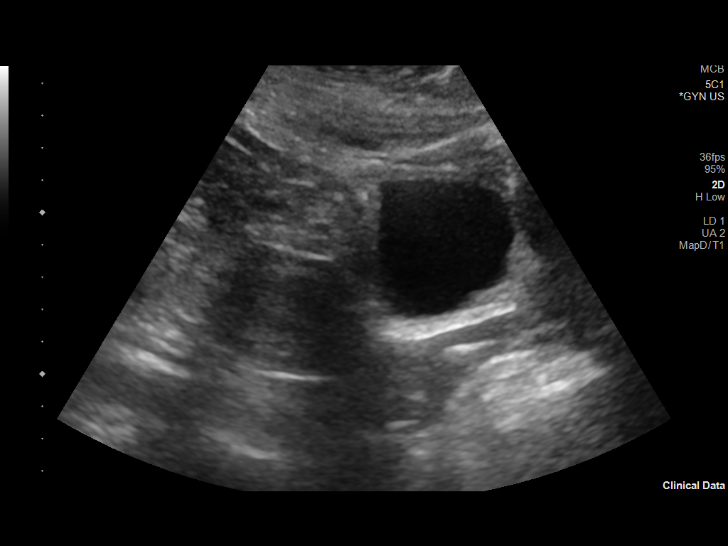
[im 5/59]
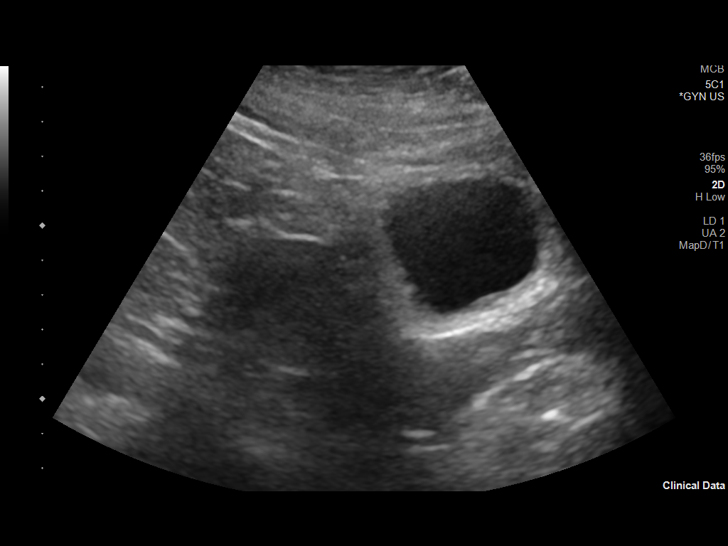
[im 10/59]
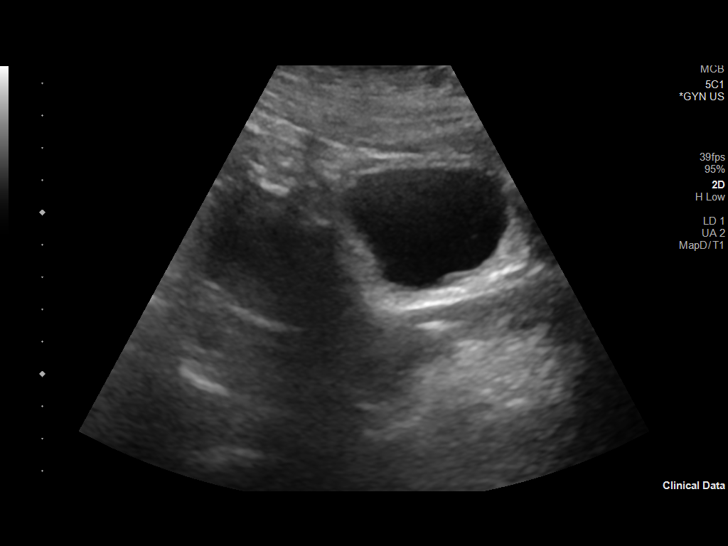
[im 15/59]
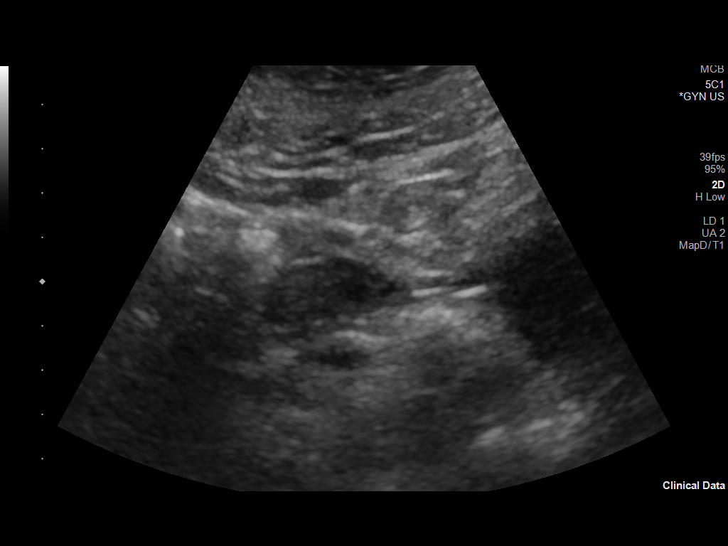
[im 20/59]
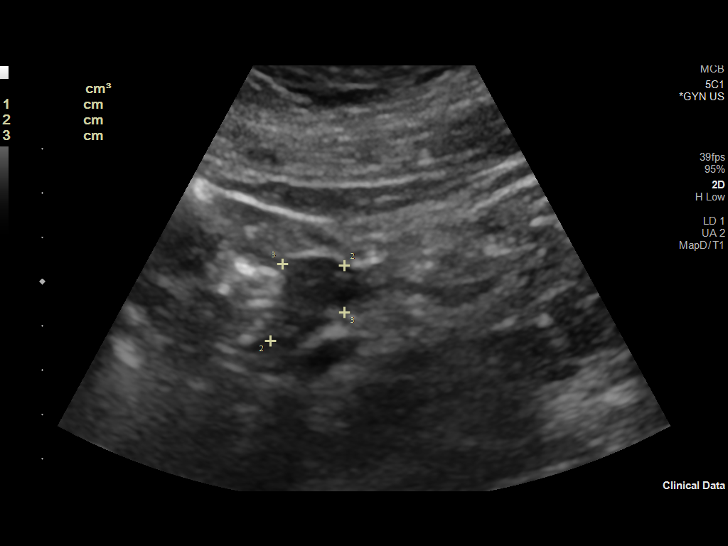
[im 22/59]
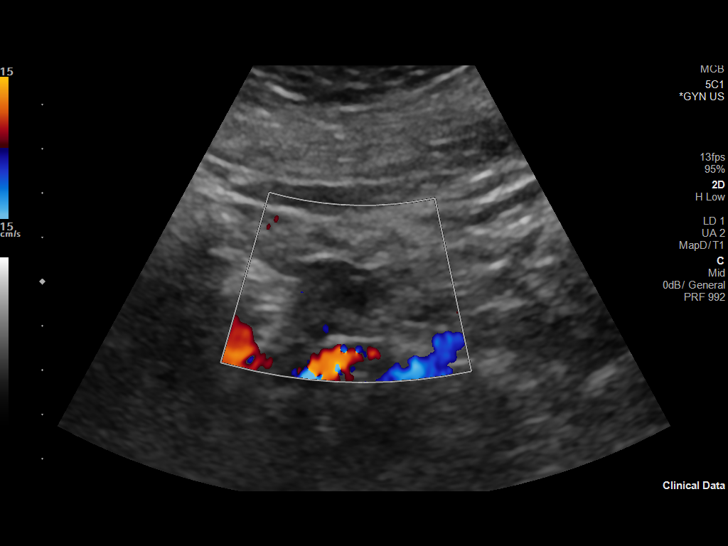
[im 27/59]
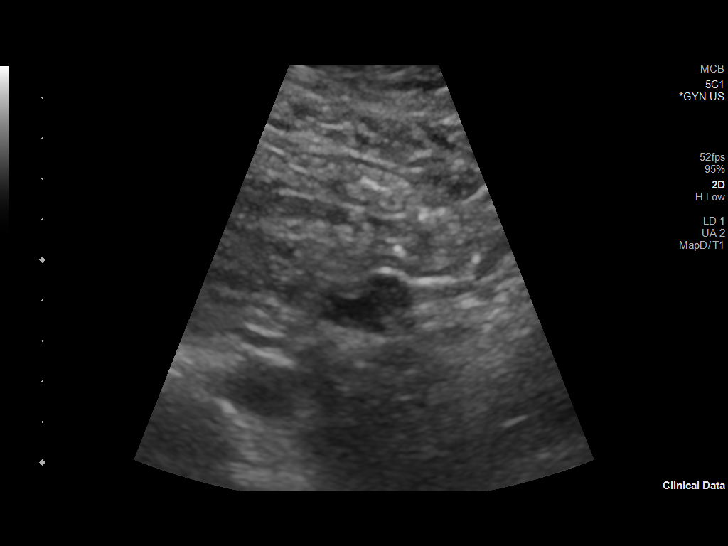
[im 32/59]
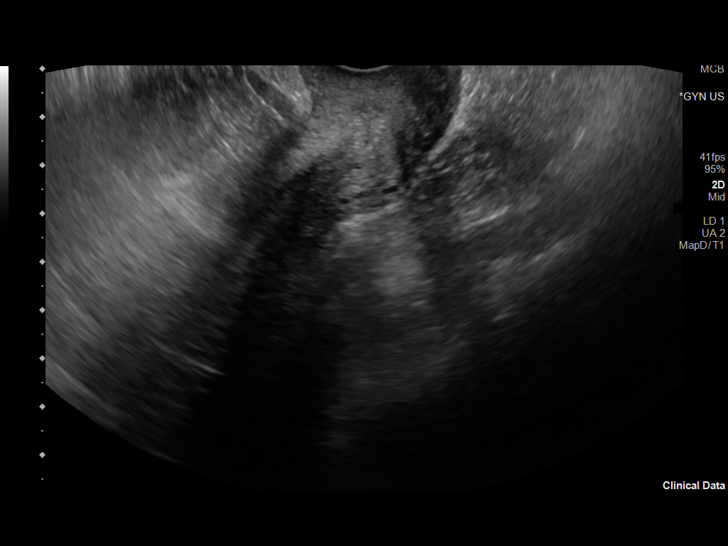
[im 37/59]
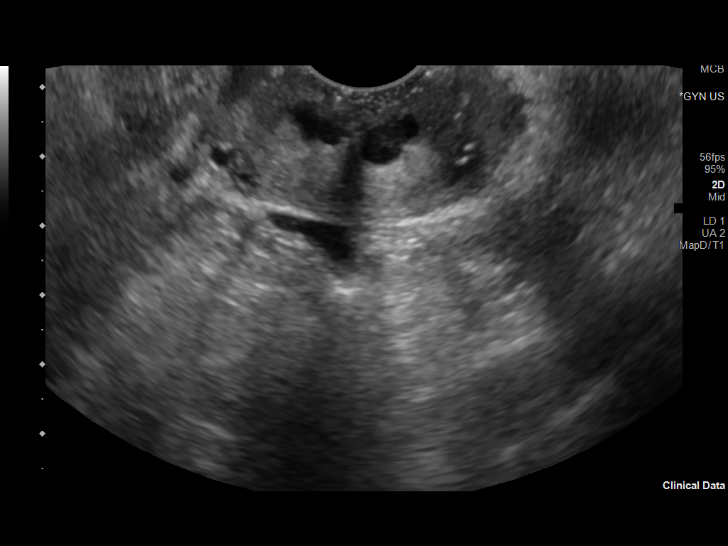
[im 39/59]
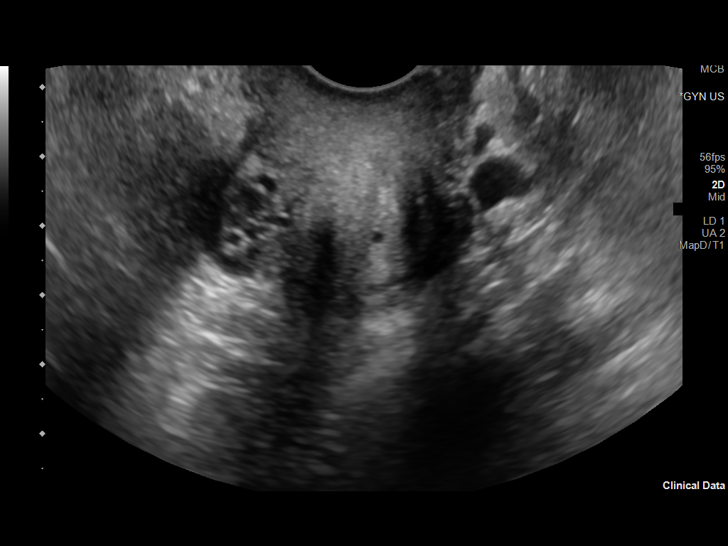
[im 44/59]
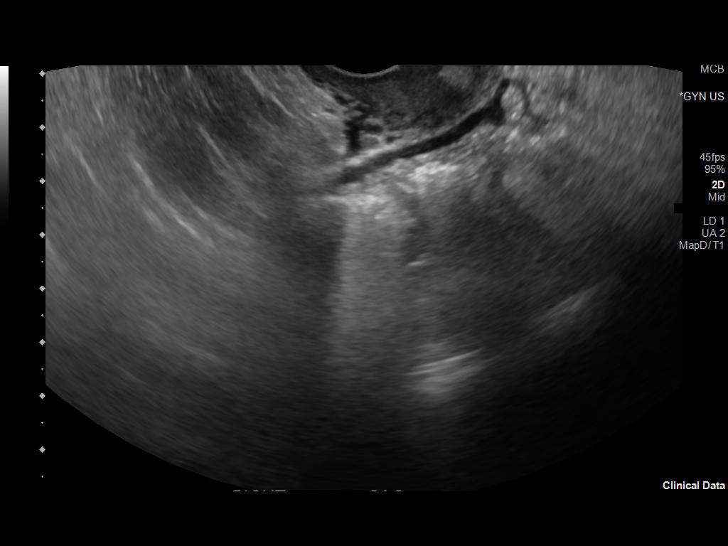
[im 49/59]
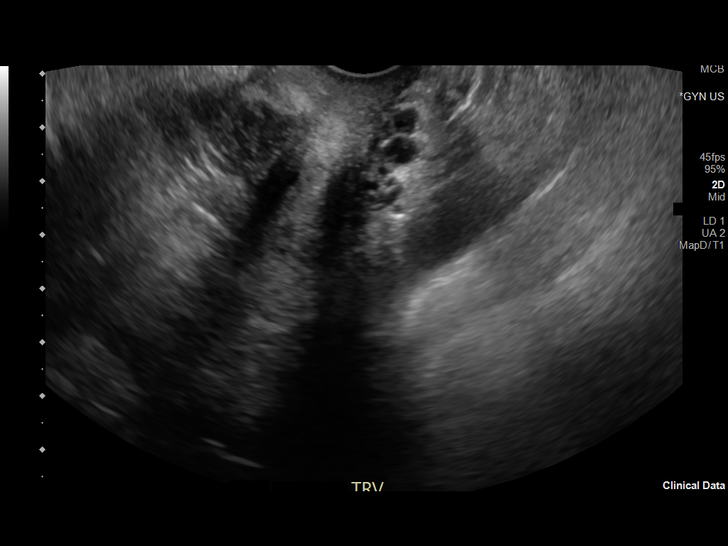
[im 54/59]
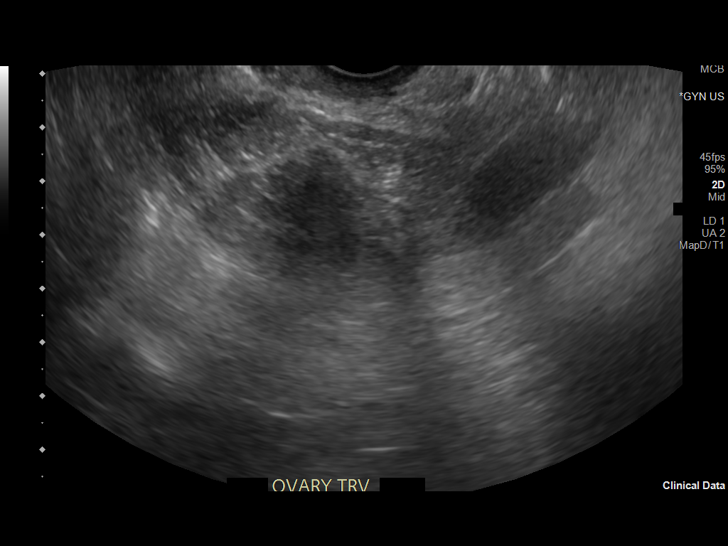
[im 59/59]
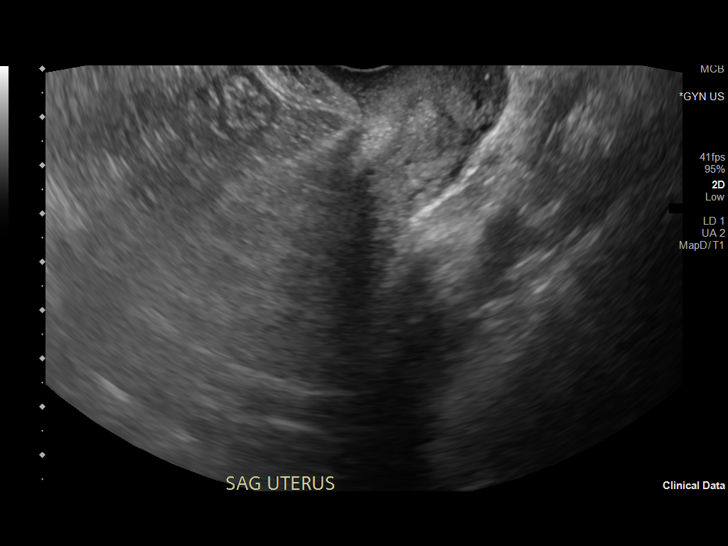

[14 of 25 positions shown; findings below may reference images not displayed]

FINDINGS: Uterus

Measurements: 8.0 x 4.2 x 4.4 cm = volume: 77 mL. No fibroids or
other mass visualized.

Endometrium

Thickness: 5 mm.  No focal abnormality visualized.

Right ovary

Measurements: 2.4 x 1.8 x 3.3 cm = volume: 7 mL. Normal
appearance/no adnexal mass.

Left ovary

Measurements: 2.1 x 1.5 x 2.8 cm = volume: 5 mL. Normal
appearance/no adnexal mass.

Other findings

No abnormal free fluid.
IMPRESSION: Normal pelvic ultrasound.

## 2021-08-01 ENCOUNTER — Emergency Department (HOSPITAL_COMMUNITY)
Admission: EM | Admit: 2021-08-01 | Discharge: 2021-08-02 | Disposition: A | Discharge status: Home / Self Care | Payer: Medicaid Other | Attending: Emergency Medicine | Admitting: Emergency Medicine

## 2021-08-01 ENCOUNTER — Emergency Department (HOSPITAL_COMMUNITY): Payer: Medicaid Other

## 2021-08-01 ENCOUNTER — Encounter (HOSPITAL_COMMUNITY): Payer: Self-pay | Admitting: Emergency Medicine

## 2021-08-01 ENCOUNTER — Other Ambulatory Visit: Payer: Self-pay

## 2021-08-01 DIAGNOSIS — Y93K1 Activity, walking an animal: Secondary | ICD-10-CM | POA: Diagnosis not present

## 2021-08-01 DIAGNOSIS — S6992XA Unspecified injury of left wrist, hand and finger(s), initial encounter: Secondary | ICD-10-CM | POA: Diagnosis not present

## 2021-08-01 DIAGNOSIS — X501XXA Overexertion from prolonged static or awkward postures, initial encounter: Secondary | ICD-10-CM | POA: Insufficient documentation

## 2021-08-01 NOTE — ED Triage Notes (Signed)
Pt reported to to ED with c/o injury 2nd, 3rd and 4th fingers while walking dog that "took off" while she was holding leash. Artificial nail completely removed to 4th finger, pt endorses difficulty flexing 2nd finger. Scrapes noted to 2nd and 3rd finger.  ?

## 2021-08-01 NOTE — ED Provider Triage Note (Signed)
Emergency Medicine Provider Triage Evaluation Note ? ?Tracy Harper , a 31 y.o. female  was evaluated in triage.  Pt complains of L index finger pain that began earlier tonight.  Patient reports that she was walking her dog and holding onto the leash with her left hand.  She states that her dog went and ran after some children and the leash snapped in her hand. She had a false nail on the L 4th digit that fell off. She has a small amount of bleeding to the nailbed however states most of her pain is in the PIP joint of the 2nd finger. Tetanus status unknown.  ? ?Review of Systems  ?Positive: + L index finger pain ?Negative: - head injury or LOC ? ?Physical Exam  ?BP (!) 188/99   Pulse (!) 106   Temp 98.5 ?F (36.9 ?C) (Oral)   Resp 18   SpO2 99%  ?Gen:   Awake, no distress   ?Resp:  Normal effort  ?MSK:   Moves extremities without difficulty  ?Other:  + swelling, ecchymosis, and TTP to the left 2nd finger PIP joint with limited ROM. Cap refill < 2 seconds. 2+ radial pulse. Nail has fallen off of the L 4th digit; there is a small amount of bleeding from the nailbed; no laceration appreciated.  ? ?Medical Decision Making  ?Medically screening exam initiated at 9:56 PM.  Appropriate orders placed.  Tracy Harper was informed that the remainder of the evaluation will be completed by another provider, this initial triage assessment does not replace that evaluation, and the importance of remaining in the ED until their evaluation is complete. ? ? ?  ?Eustaquio Maize, PA-C ?08/01/21 2201 ? ?

## 2021-08-02 NOTE — ED Provider Notes (Signed)
?  [facility]-EMERGENCY DEPT ?[facility] Emergency Department ?Provider Note ?MRN:  [id number]  ?Arrival date & time: 08/02/21    ? ?Chief Complaint   ?Finger Injury ?  ?History of Present Illness   ?Tracy Harper is a 31 y.o. year-old female presents to the ED with chief complaint of finger pain.  She states that she was walking her dog and the dog pulled the leash or collar and twisted her fingers of her left hand and pulled off the fingernail of the left ring finger.  She has primarily complaining of left index finger pain, and reports increased pain with movement.  She wanted to be certain that it was not broken.  She has been applying ice. ? ? ? ? ?Review of Systems  ?Pertinent review of systems noted in HPI.  ? ? ?Physical Exam  ? ?Vitals:  ? 08/01/21 2126 08/01/21 2137  ?BP: (!) 188/99   ?Pulse: (!) 126 (!) 106  ?Resp: 18   ?Temp: 98.5 ?F (36.9 ?C)   ?SpO2: 99%   ?  ?CONSTITUTIONAL:  well-appearing, NAD ?NEURO:  Alert and oriented x 3,  ?EYES:  eyes equal and reactive ?ENT/NECK:  Supple, no stridor  ?CARDIO:  appears well-perfused  ?PULM:  No respiratory distress,  ?GI/GU:  non-distended,  ?MSK/SPINE: Mild swelling of the left index finger, range of motion limited secondary to pain/effort, no bony deformity, no contusion, intact cap refill and distal sensation ?SKIN:  no rash, atraumatic ? ? ?*Additional and/or pertinent findings included in MDM below ? ?Diagnostic and Interventional Summary  ? ? ?Labs Reviewed - No data to display  ?DG Hand Complete Left  ?Final Result  ?  ?  ?Medications - No data to display  ? ?Procedures  /  Critical Care ?Procedures ? ?ED Course and Medical Decision Making  ?I have reviewed the triage vital signs, the nursing notes, and pertinent available records from the EMR. ? ?Complexity of Problems Addressed: ?Moderate Complexity: Acute complicated illness or injury, requiring diagnostic workup as ordered and performed below. ?Comorbidities affecting this illness/injury  include: ?None ?Social Determinants Affecting Care: ? No clinically significant social determinants affecting this chief complaint.. ? ? ?ED Course: ?After considering the following differential, finger sprain, fracture, I ordered a finger splint and agree with the images that were ordered in triage. ?I personally visualized the x-ray of the left hand, no fracture or dislocation and agree with radiologist interpretation. ? ?  ? ?Consultants: ?No consultations were needed in caring for this patient. ? ?Treatment and Plan: ?Finger sprain ?- Finger splint ?- PCP follow-up ? ?Emergency department workup does not suggest an emergent condition requiring admission or immediate intervention beyond  what has been performed at this time. The patient is safe for discharge and has  been instructed to return immediately for worsening symptoms, change in  symptoms or any other concerns ? ? ? ?Final Clinical Impressions(s) / ED Diagnoses  ? ?  ICD-10-CM   ?1. Injury of finger of left hand, initial encounter  S69.92XA   ?  ?  ?ED Discharge Orders   ? ? None  ? ?  ?  ? ? ?Discharge Instructions Discussed with and Provided to Patient:  ? ?Discharge Instructions   ?None ?  ? ?  ?Roxy Horseman, PA-C ?08/02/21 0025 ? ?  ?Dione Booze, MD ?08/02/21 [phone] ? ?

## 2022-12-13 ENCOUNTER — Encounter (HOSPITAL_COMMUNITY): Payer: Self-pay

## 2022-12-13 ENCOUNTER — Other Ambulatory Visit: Payer: Self-pay

## 2022-12-13 ENCOUNTER — Emergency Department (HOSPITAL_COMMUNITY): Payer: 59

## 2022-12-13 ENCOUNTER — Emergency Department (HOSPITAL_COMMUNITY)
Admission: EM | Admit: 2022-12-13 | Discharge: 2022-12-13 | Disposition: A | Discharge status: Home / Self Care | Payer: 59 | Attending: Emergency Medicine | Admitting: Emergency Medicine

## 2022-12-13 DIAGNOSIS — M7989 Other specified soft tissue disorders: Secondary | ICD-10-CM

## 2022-12-13 DIAGNOSIS — E119 Type 2 diabetes mellitus without complications: Secondary | ICD-10-CM | POA: Insufficient documentation

## 2022-12-13 DIAGNOSIS — Z91040 Latex allergy status: Secondary | ICD-10-CM | POA: Insufficient documentation

## 2022-12-13 DIAGNOSIS — R600 Localized edema: Secondary | ICD-10-CM | POA: Insufficient documentation

## 2022-12-13 DIAGNOSIS — Z79899 Other long term (current) drug therapy: Secondary | ICD-10-CM | POA: Insufficient documentation

## 2022-12-13 DIAGNOSIS — R202 Paresthesia of skin: Secondary | ICD-10-CM | POA: Insufficient documentation

## 2022-12-13 DIAGNOSIS — J45909 Unspecified asthma, uncomplicated: Secondary | ICD-10-CM | POA: Insufficient documentation

## 2022-12-13 DIAGNOSIS — I10 Essential (primary) hypertension: Secondary | ICD-10-CM | POA: Insufficient documentation

## 2022-12-13 LAB — CBC
HCT: 43.9 % (ref 36.0–46.0)
Hemoglobin: 14.6 g/dL (ref 12.0–15.0)
MCH: 29.1 pg (ref 26.0–34.0)
MCHC: 33.3 g/dL (ref 30.0–36.0)
MCV: 87.6 fL (ref 80.0–100.0)
Platelets: 310 K/uL (ref 150–400)
RBC: 5.01 MIL/uL (ref 3.87–5.11)
RDW: 12.5 % (ref 11.5–15.5)
WBC: 13.3 K/uL — ABNORMAL HIGH (ref 4.0–10.5)
nRBC: 0 % (ref 0.0–0.2)

## 2022-12-13 LAB — BASIC METABOLIC PANEL
Anion gap: 12 (ref 5–15)
BUN: 7 mg/dL (ref 6–20)
CO2: 21 mmol/L — ABNORMAL LOW (ref 22–32)
Calcium: 8.6 mg/dL — ABNORMAL LOW (ref 8.9–10.3)
Chloride: 103 mmol/L (ref 98–111)
Creatinine, Ser: 0.71 mg/dL (ref 0.44–1.00)
GFR, Estimated: >60 mL/min (ref >60)
Glucose, Bld: 244 mg/dL — ABNORMAL HIGH (ref 70–99)
Potassium: 3.8 mmol/L (ref 3.5–5.1)
Sodium: 136 mmol/L (ref 135–145)

## 2022-12-13 LAB — D-DIMER, QUANTITATIVE: D-Dimer, Quant: <0.27 ug/mL-FEU (ref 0.00–0.50)

## 2022-12-13 NOTE — Discharge Instructions (Addendum)
Your leg swelling is most likely due to high blood pressure causing fluid to be pushed out into your legs. Your labs looked good today. - Continue taking your lisinopril and amlodipine daily. - You can wear compression stockings to help improve the swelling - Your sugar was high today. I recommend resuming your diabetes medications.  - Make an appointment to see your PCP within the next week to see if your medications need to be adjusted.   Please come back to the ED if  - You start having trouble breathing - Your legs start to get painful - One leg looks more swollen and red compared to the other

## 2022-12-13 NOTE — ED Triage Notes (Signed)
Pt came in via POV d/t having a feeling in her lips to her Lt eye that felt like a tingling pins/needles. Then a warm sensation spreading from her chest to her Lt shoulder. She then felt swelling in her ankles/feet and after laying down to rest she woke up & her Lt foot & knee still feels tight. A/O4, denies current pain. Does report Hx of anxiety & "PVC" (per pt).

## 2022-12-13 NOTE — ED Provider Notes (Signed)
Tracy Harper   CSN: [id number] Arrival date & time: 12/13/22  1019     History  Chief Complaint  Patient presents with   Leg Swelling   lip tingling    Tracy Harper is a 32 y.o. female w/ hx of HTN, asthma, anxiety, PVC, T2DM and obesity that p/w BL LE edema.   Yesterday at work, L side of face felt tingly, pins and needles. She gets this tingling sensation on R side of face normally, but not usually on L side. She also felt a "warm" sensation in chest that radiated to L shoulder, that she thought might have been from gas. Both the tingling and warm sensation improved last night. She also noticed that her feet looked more swollen than usual last night, she took her lisinopril and amlodipine. Reports she did not take her antihypertensives for the past week prior to last night. This morning, she feels like her swelling improved but still more swollen than usual, and then felt tightness in her L knee. She went to work, was speaking with a patient and felt short of breath. A provider at her work recommended she come to ED for eval.   Denies chest pain, leg pain, dysuria, fever. She is no longer feeling out of breath.       Home Medications Prior to Admission medications   Medication Sig Start Date End Date Taking? Authorizing Provider  albuterol (VENTOLIN HFA) 108 (90 Base) MCG/ACT inhaler Inhale 2 puffs into the lungs every 6 (six) hours as needed for wheezing or shortness of breath.     [provider]  cephALEXin (KEFLEX) 500 MG capsule Take 1 capsule (500 mg total) by mouth 4 (four) times daily. 10/09/18   Arlyn Dunning, PA-C  ketorolac (TORADOL) 10 MG tablet Take 10 mg by mouth every 6 (six) hours as needed for moderate pain (kidney stones).    [provider]  lisinopril (ZESTRIL) 10 MG tablet Take 1 tablet (10 mg total) by mouth daily. 10/08/18 10/08/19  Elson Areas, PA-C  metroNIDAZOLE (FLAGYL) 500 MG  tablet Take 1 tablet (500 mg total) by mouth 2 (two) times daily. 10/08/18   Elson Areas, PA-C      Allergies    Amoxicillin, Bactrim [sulfamethoxazole-trimethoprim], Shellfish allergy, and Latex    Review of Systems   Review of Systems  Physical Exam Updated Vital Signs BP (!) 127/53   Pulse 73   Temp 98.1 F (36.7 C) (Oral)   Resp 16   Ht 5\' 2"  (1.575 m)   Wt 127 kg   SpO2 100%   BMI 51.21 kg/m  Physical Exam Gen: Alert, well-appearing, pleasant woman speaking in full sentences. HEENT: NCAT. MMM. Resp: CTAB, no crackles or wheezing. Normal WOB on RA.  CV: RRR, no murmurs.  Abm: Soft, nontender, nondistended. Normal BS.  Ext: 1+ Pitting edema in BL LE. BL LE appear symmetric, nontenter, nonerythematous. No tenderness to palpation of BL knees. No effusion noted in BL knees (although difficult to assess due to body habitus).   ED Results / Procedures / Treatments   Labs (all labs ordered are listed, but only abnormal results are displayed) Labs Reviewed  BASIC METABOLIC PANEL - Abnormal; Notable for the following components:      Result Value   CO2 21 (*)    Glucose, Bld 244 (*)    Calcium 8.6 (*)    All other components within normal limits  CBC -  Abnormal; Notable for the following components:   WBC 13.3 (*)    All other components within normal limits  D-DIMER, QUANTITATIVE    EKG EKG Interpretation Date/Time:  Tuesday December 13 2022 10:31:33 EDT Ventricular Rate:  101 PR Interval:  138 QRS Duration:  106 QT Interval:  318 QTC Calculation: 413 R Axis:   100  Text Interpretation: Sinus tachycardia Borderline right axis deviation Repol abnrm suggests ischemia, diffuse leads Baseline wander in lead(s) V6 No significant change since last tracing Confirmed by Jacalyn Lefevre [phone]) on 12/13/2022 10:55:00 AM  Radiology DG Chest Portable 1 View  Result Date: 12/13/2022 CLINICAL DATA:  shortness of breath EXAM: PORTABLE CHEST - 1 VIEW COMPARISON:  None  available. FINDINGS: Cardiomediastinal silhouette and pulmonary vasculature are within normal limits. Lungs are clear. IMPRESSION: No acute cardiopulmonary process. Electronically Signed   By: Acquanetta Belling M.D.   On: 12/13/2022 12:04    Procedures Procedures   Medications Ordered in ED Medications - No data to display  ED Course/ Medical Decision Making/ A&P   Wells Score 0                             Medical Decision Making:   Tracy Harper is a 32 y.o. female who presented to the ED today with BL LE swelling detailed above.     Complete initial physical exam performed, notably the patient had BL LE pitting edema.  Reviewed and confirmed nursing documentation for past medical history, family history, social history.    Initial Assessment:   With the patient's presentation of BL LE edema, differential includes venous insufficiency, electrolyte abnormalities, CHF, DVT. Venous insufficiency is considered given BL nature of edema, obesity, and chronic HTN (pt had not taken anti-HTN meds for a week prior to last night). CHF is also considered given BL nature and chornic HTN. Will check BMP to workup potential electrolyte abnormalities. DVT is less likely given BL symmetric presentation, and Wells Score 0.   This is most consistent with an acute complicated illness  Initial Plan:  Ddimer to rule out DVT  Screening labs including CBC and Metabolic panel to evaluate for infectious or metabolic etiology of disease.  CXR to workup episode of SOB this morning.  EKG to evaluate for cardiac pathology Objective evaluation as below reviewed   Initial Study Results:   Laboratory  All laboratory results reviewed without evidence of clinically relevant pathology.   - Ddimer wnl.  EKG EKG was reviewed independently. Rate, rhythm, axis, intervals all examined and without medically relevant abnormality. ST segments without concerns for elevations.    Radiology:  All images reviewed  independently. Agree with radiology report at this time.   DG Chest Portable 1 View  Result Date: 12/13/2022 CLINICAL DATA:  shortness of breath EXAM: PORTABLE CHEST - 1 VIEW COMPARISON:  None available. FINDINGS: Cardiomediastinal silhouette and pulmonary vasculature are within normal limits. Lungs are clear. IMPRESSION: No acute cardiopulmonary process. Electronically Signed   By: Acquanetta Belling M.D.   On: 12/13/2022 12:04    Reassessment and Plan:   - Pt's worsening edema is most likely 2/2 her not taking BP meds for past week. Advised pt to take Lisinopril and amlodipine consistently and to f/u with PCP within 1 week to f/u on BP and edema.  - Ddimer negative and VSS on RA. DVT is unlikely. - Recommended wearing compression stockings to improve edema.  - Labs notable for elevated WBC,  which pt reports is chronic and is planning to see hematologist for. Given absence of fever and infectious symptoms, further infectious workup is not indicated at this time.    Final Clinical Impression(s) / ED Diagnoses Final diagnoses:  Leg swelling    Rx / DC Orders ED Discharge Orders     None         Lincoln Brigham, MD 12/13/22 1529    Jacalyn Lefevre, MD 12/13/22 1552
